# Patient Record
Sex: Female | Born: 1985 | Race: White | Marital: Single | Smoking: Former smoker
Health system: Southern US, Community
[De-identification: ages and names within clinical notes are randomized; demographics above are authoritative.]

---

## 2017-08-06 ENCOUNTER — Other Ambulatory Visit: Payer: Self-pay | Admitting: Orthopedic Surgery

## 2017-08-06 DIAGNOSIS — M4306 Spondylolysis, lumbar region: Secondary | ICD-10-CM

## 2017-08-12 ENCOUNTER — Other Ambulatory Visit: Payer: Self-pay | Admitting: Orthopedic Surgery

## 2017-08-12 DIAGNOSIS — M4306 Spondylolysis, lumbar region: Secondary | ICD-10-CM

## 2017-08-17 ENCOUNTER — Ambulatory Visit
Admission: RE | Admit: 2017-08-17 | Discharge: 2017-08-17 | Disposition: A | Discharge status: Home / Self Care | Payer: Self-pay | Source: Ambulatory Visit | Attending: Orthopedic Surgery | Admitting: Orthopedic Surgery

## 2017-08-17 ENCOUNTER — Ambulatory Visit
Admission: RE | Admit: 2017-08-17 | Discharge: 2017-08-17 | Disposition: A | Discharge status: Home / Self Care | Payer: Worker's Compensation | Source: Ambulatory Visit | Attending: Orthopedic Surgery | Admitting: Orthopedic Surgery

## 2017-08-17 DIAGNOSIS — M4306 Spondylolysis, lumbar region: Secondary | ICD-10-CM

## 2017-08-17 MED ORDER — METHYLPREDNISOLONE ACETATE 40 MG/ML INJ SUSP (RADIOLOG: 120.0000 mg | Freq: Once | INTRAMUSCULAR | Status: DC

## 2018-04-05 ENCOUNTER — Ambulatory Visit (INDEPENDENT_AMBULATORY_CARE_PROVIDER_SITE_OTHER): Payer: Worker's Compensation | Admitting: Orthopaedic Surgery

## 2018-04-05 ENCOUNTER — Telehealth (INDEPENDENT_AMBULATORY_CARE_PROVIDER_SITE_OTHER): Payer: Self-pay

## 2018-04-05 ENCOUNTER — Encounter (INDEPENDENT_AMBULATORY_CARE_PROVIDER_SITE_OTHER): Payer: Self-pay | Admitting: Orthopaedic Surgery

## 2018-04-05 ENCOUNTER — Ambulatory Visit (INDEPENDENT_AMBULATORY_CARE_PROVIDER_SITE_OTHER): Payer: Worker's Compensation

## 2018-04-05 VITALS — BP 177/113 | HR 93 | Ht 64.5 in | Wt 260.0 lb

## 2018-04-05 DIAGNOSIS — G8929 Other chronic pain: Secondary | ICD-10-CM | POA: Diagnosis not present

## 2018-04-05 DIAGNOSIS — M545 Low back pain, unspecified: Secondary | ICD-10-CM

## 2018-04-05 NOTE — Telephone Encounter (Signed)
-----   Message from Eldred Manges, MD sent at 04/05/2018  2:47 PM EST ----- WC thanks

## 2018-04-05 NOTE — Telephone Encounter (Signed)
Faxed todays office note to case manager Robin Niedermeier Fax#[phone]

## 2018-04-05 NOTE — Progress Notes (Signed)
Office Visit Note [facility] transfer   Patient: Karen Fitzpatrick           Date of Birth: September 30, 1985           MRN: [id number] Visit Date: 04/05/2018              Requested by: No referring provider defined for this encounter. PCP: Patient, No Pcp Per   Assessment & Plan: Visit Diagnoses:  1. Chronic low back pain, unspecified back pain laterality, unspecified whether sciatica present   2.   L5 bilateral spondylolysis with L5-S1 spondylolisthesis 5 mm.  Plan: We discussed exercises she can do to work on weight loss to help unload her lumbar spine.  She needs to get down to a BMI of 40 and that target weight is 235.  I plan to recheck her in 3 months when she reaches her target weight if she still having symptoms we could consider myelogram CT scan to evaluate her for dynamic instability with compression of the degenerative disc at the 5 1 level.  She gets good relief with some weight loss and no additional studies would be indicated.  MRI report MRI images were reviewed with the patient and her case manager.  Prescription given for pool therapy or pool membership.  She has a [employer] close to where she works that she could utilize for weight loss which should not bother her back symptoms.  Case manager will work with the [employer]. carrier to see if they can authorize this.  I plan to recheck her in 3 months.  Follow-Up Instructions: Return in about 3 months (around 07/05/2018).   Orders:  Orders Placed This Encounter  Procedures  . XR Lumbar Spine 2-3 Views   No orders of the defined types were placed in this encounter.     Procedures: No procedures performed   Clinical Data: No additional findings.   Subjective: Chief Complaint  Patient presents with  . Lower Back - Pain    HPI 33 year old female here with her case manager Maeola Sarah, RN, CCM with history of on-the-job injury in December 2018 when she was working at [employer] on top of a ladder trying to reach an  empty tote off the top shelf and felt a pop in her back.  2 days later started having increased pain and since that time is been through physical therapy, medication conservative treatment, some improvement with dry needling.  She has increased pain with sitting or standing in 1 position for period of time she does better if she changes positions frequently.  She has midline back pain.  She does have a history of kidney stones and had an episode of nephrolithiasis on the left since her 2018 injury but this is not related.  She had been treated by Dr. Yevette Edwards and had a pars injection bilaterally at L5.  She has had an MRI scan that shows some disc desiccation and trace listhesis at L5-S1.  Other disc showed good hydration and no significant compression.  She did have a mild amount of increased facet joint fluid at L4-5.  Pars defect injections were done under CT and some of the fluid leaked into the epidural space.  He continues to work.  [place]  Review of Systems positive for depression, hypertension.  1/2 pack/day smoking history.  She has 2 children.  Patient Social research officer, government at [employer].  Negative for stroke, heart attack 14 point review of systems otherwise negative  is obtains HPI.   Objective: Vital Signs: BP (!) 177/113   Pulse 93   Ht 5' 4.5" (1.638 m)   Wt 260 lb (117.9 kg)   BMI 43.94 kg/m   Physical Exam Constitutional:      Appearance: She is well-developed.  HENT:     Head: Normocephalic.     Right Ear: External ear normal.     Left Ear: External ear normal.  Eyes:     Pupils: Pupils are equal, round, and reactive to light.  Neck:     Thyroid: No thyromegaly.     Trachea: No tracheal deviation.  Cardiovascular:     Rate and Rhythm: Normal rate.  Pulmonary:     Effort: Pulmonary effort is normal.  Abdominal:     Palpations: Abdomen is soft.  Skin:    General: Skin is warm and dry.  Neurological:     Mental Status: She is alert and  oriented to person, place, and time.  Psychiatric:        Behavior: Behavior normal.     Ortho Exam patient is able to heel and toe walk easily she gets from sitting to standing comfortably.  Forward flexion fingertips to toes with her knees extended.  She has some limitation of extension with some lumbosacral discomfort.  Tenderness at the lumbosacral junction.  No sciatic notch tenderness.  Negative logroll to the hips knee and ankle jerk are 2+ and symmetrical anterior tib gastrocsoleus is symmetrical negative Homan.  No edema.  Good capillary refill.  Specialty Comments:  No specialty comments available.  Imaging: Xr Lumbar Spine 2-3 Views  Result Date: 04/05/2018 AP lateral lumbar spine x-rays with lateral lumbar flexion-extension stress films are obtained and reviewed.  This shows 5 mm of anterolisthesis at L5-S1 with pars defect without shifting on flexion-extension.  Negative for acute changes. Impression: L5-S1 anterolisthesis with pars defect stable on flexion-extension.    PMFS History: There are no active problems to display for this patient.  History reviewed. No pertinent past medical history.  History reviewed. No pertinent family history.  History reviewed. No pertinent surgical history. Social History   Occupational History  . Not on file  Tobacco Use  . Smoking status: Not on file  Substance and Sexual Activity  . Alcohol use: Not on file  . Drug use: Not on file  . Sexual activity: Not on file

## 2018-06-08 ENCOUNTER — Telehealth (INDEPENDENT_AMBULATORY_CARE_PROVIDER_SITE_OTHER): Payer: Self-pay

## 2018-06-08 ENCOUNTER — Other Ambulatory Visit: Payer: Self-pay

## 2018-06-08 ENCOUNTER — Encounter (INDEPENDENT_AMBULATORY_CARE_PROVIDER_SITE_OTHER): Payer: Self-pay | Admitting: Orthopaedic Surgery

## 2018-06-08 ENCOUNTER — Ambulatory Visit (INDEPENDENT_AMBULATORY_CARE_PROVIDER_SITE_OTHER): Payer: Worker's Compensation | Admitting: Orthopaedic Surgery

## 2018-06-08 VITALS — BP 153/105 | HR 82 | Ht 64.5 in | Wt 259.0 lb

## 2018-06-08 DIAGNOSIS — M545 Low back pain, unspecified: Secondary | ICD-10-CM

## 2018-06-08 DIAGNOSIS — G8929 Other chronic pain: Secondary | ICD-10-CM | POA: Diagnosis not present

## 2018-06-08 NOTE — Progress Notes (Signed)
Office Visit Note   Patient: Karen Fitzpatrick           Date of Birth: 26-Jan-1986           MRN: [id number] Visit Date: 06/08/2018              Requested by: No referring provider defined for this encounter. PCP: Patient, No Pcp Per   Assessment & Plan: Visit Diagnoses:  1. Chronic low back pain, unspecified back pain laterality, unspecified whether sciatica present     Plan: Patient will continue to work on weight loss.  I plan to recheck her in 2 months and we will weigh her on return.  She was congratulated on her efforts.  Prescription given for continued water aerobics.  Where she does her therapy they also have an exercise bike she could ride this in addition as part of her therapy.  I discussed with her when she reaches her target weight we can proceed with myelogram CAT scan for evaluation of the L5-S1 spondylolisthesis with pars defect.  I discussed with patient when she reaches her target weight we can proceed with imaging  Follow-Up Instructions: Return in about 2 months (around 08/08/2018).   Orders:  Orders Placed This Encounter  Procedures   Ambulatory referral to Physical Therapy   No orders of the defined types were placed in this encounter.     Procedures: No procedures performed   Clinical Data: No additional findings.   Subjective: Chief Complaint  Patient presents with   Lower Back - Pain, Follow-up    OTJI 02/2017    HPI 33 year old female returns with ongoing low back pain date of injury was in December 2018.  Patient's been doing water therapy and is continued to diet got rid of sweet drinks and is lost 14 pounds she was congratulated on her efforts.  She was here with the rehab nurse Maeola Sarah, RN, CCM.  Fax number is [phone] with [facility].  Patient states she is planning on quitting smoking today.  She is requesting repeat prescription for continued water therapy.  She is lost 14 pounds and her weights have been 254 some days and her goal  to reach BMI of 40 is to get down to 235.  She is continuing to work.  Work slip given regular work.  Patient states she still having symptoms but as she is done therapy and lost a little bit of weight she has had improvement in her back pain buttocks pain and pain in her legs.  Review of Systems 14 point systems positive for depression hypertension smoker she states she is quitting today.  Otherwise unchanged from 04/05/2018.   Objective: Vital Signs: BP (!) 153/105    Pulse 82    Ht 5' 4.5" (1.638 m)    Wt 259 lb (117.5 kg)    BMI 43.77 kg/m   Physical Exam Constitutional:      Appearance: She is well-developed.  HENT:     Head: Normocephalic.     Right Ear: External ear normal.     Left Ear: External ear normal.  Eyes:     Pupils: Pupils are equal, round, and reactive to light.  Neck:     Thyroid: No thyromegaly.     Trachea: No tracheal deviation.  Cardiovascular:     Rate and Rhythm: Normal rate.  Pulmonary:     Effort: Pulmonary effort is normal.  Abdominal:     Palpations: Abdomen is soft.  Skin:    General:  Skin is warm and dry.  Neurological:     Mental Status: She is alert and oriented to person, place, and time.  Psychiatric:        Behavior: Behavior normal.     Ortho Exam patient has normal gait no rash over exposed skin.  No motor atrophy.  She gets from sitting to standing more comfortably. Specialty Comments:  No specialty comments available.  Imaging: No results found.   PMFS History: Patient Active Problem List   Diagnosis Date Noted   Chronic low back pain 06/08/2018   No past medical history on file.  No family history on file.  No past surgical history on file. Social History   Occupational History   Not on file  Tobacco Use   Smoking status: Not on file  Substance and Sexual Activity   Alcohol use: Not on file   Drug use: Not on file   Sexual activity: Not on file

## 2018-06-08 NOTE — Telephone Encounter (Signed)
Faxed office note to case manager Robin Niedermeier Fax#[phone]

## 2018-06-08 NOTE — Telephone Encounter (Signed)
-----   Message from Eldred Manges, MD sent at 06/08/2018  9:53 AM EDT ----- Cc to W/C and rehab nurse

## 2018-08-02 ENCOUNTER — Ambulatory Visit (INDEPENDENT_AMBULATORY_CARE_PROVIDER_SITE_OTHER): Payer: Worker's Compensation | Admitting: Orthopaedic Surgery

## 2018-08-02 ENCOUNTER — Encounter: Payer: Self-pay | Admitting: Orthopaedic Surgery

## 2018-08-02 ENCOUNTER — Other Ambulatory Visit: Payer: Self-pay

## 2018-08-02 ENCOUNTER — Telehealth: Payer: Self-pay

## 2018-08-02 VITALS — Ht 64.0 in | Wt 261.0 lb

## 2018-08-02 DIAGNOSIS — M545 Low back pain, unspecified: Secondary | ICD-10-CM

## 2018-08-02 DIAGNOSIS — G8929 Other chronic pain: Secondary | ICD-10-CM | POA: Diagnosis not present

## 2018-08-02 NOTE — Telephone Encounter (Signed)
-----   Message from Eldred Manges, MD sent at 08/02/2018 10:32 AM EDT ----- Cc W/C and rehab RN

## 2018-08-02 NOTE — Telephone Encounter (Signed)
Faxed note to case Optician, dispensing with [employer] Fax#[phone]

## 2018-08-02 NOTE — Progress Notes (Signed)
Office Visit Note   Patient: Karen Fitzpatrick           Date of Birth: 09/19/1985           MRN: [id number] Visit Date: 08/02/2018              Requested by: No referring provider defined for this encounter. PCP: Patient, No Pcp Per   Assessment & Plan: Visit Diagnoses:  1. Chronic low back pain, unspecified back pain laterality, unspecified whether sciatica present     Plan: Patient has bilateral L5 pars defects with anterolisthesis.  Chronic pain and she needs to proceed with reaching her target weight of 235  she can contact us and we can proceed with lumbar myelogram CT scan for evaluation and see if she is a surgical candidate.  Currently she is not been scheduled for a surgery date for gastric sleeve.  She can call let us know.  She continues on regular work work slip given.  MMI date is not set and no impairment is assigned at this point.  Follow-Up Instructions: No follow-ups on file.   Orders:  No orders of the defined types were placed in this encounter.  No orders of the defined types were placed in this encounter.     Procedures: No procedures performed   Clinical Data: No additional findings.   Subjective: Chief Complaint  Patient presents with  . Lower Back - Follow-up    OTJI 02/2017    HPI 33 year old female seen in follow-up for an on-the-job injury.  She still doing regular work.  She has some spondylolisthesis with spondylolysis at L5.  [place] and is seeing a Teacher, English as a foreign languagebariatric surgeon from [facility] and is in the process of meeting all the criteria to proceed with gastric sleeve procedure.  She quit smoking now for 1 week and has to make it complete month nicotine free.  She has some endoscopy and also has a 10 page report she has to do before the surgery to meet criteria to proceed.  She continues to have ongoing back pain symptoms.  Weight is up a couple pounds since she quit smoking.  Original injury when she fell off a ladder.  Today she is here with  Maeola Sarahobin Niedermeier, RN, CCM, fax number [phone].  Patient continues to have problems with back pain unchanged from my previous notations.  She has had previous pars injections by Dr. Yevette Edwardsumonski.  Desiccation of L5-S1 disc with pars defects. .  Review of Systems positive for morbid obesity BMI 44.8, depression, hypertension.  Negative for stroke heart attack otherwise negative and updated from 04/05/2018 office visit.   Objective: Vital Signs: Ht 5\' 4"  (1.626 m)   Wt 261 lb (118.4 kg)   BMI 44.80 kg/m   Physical Exam Constitutional:      Appearance: She is well-developed.  HENT:     Head: Normocephalic.     Right Ear: External ear normal.     Left Ear: External ear normal.  Eyes:     Pupils: Pupils are equal, round, and reactive to light.  Neck:     Thyroid: No thyromegaly.     Trachea: No tracheal deviation.  Cardiovascular:     Rate and Rhythm: Normal rate.  Pulmonary:     Effort: Pulmonary effort is normal.  Abdominal:     Palpations: Abdomen is soft.  Skin:    General: Skin is warm and dry.  Neurological:     Mental Status: She is alert and  oriented to person, place, and time.  Psychiatric:        Behavior: Behavior normal.     Ortho Exam normal heel toe gait.  Patient can walk on her toes walk on her heels.  She gets from sitting to standing without difficulty.  Continue discomfort with lumbar flexion extension.  Negative logroll to the hips.  No atrophy no rash over exposed skin.  Specialty Comments:  No specialty comments available.  Imaging: No results found.   PMFS History: Patient Active Problem List   Diagnosis Date Noted  . Chronic low back pain 06/08/2018   No past medical history on file.  No family history on file.  No past surgical history on file. Social History   Occupational History  . Not on file  Tobacco Use  . Smoking status: Former Games developer  . Smokeless tobacco: Never Used  Substance and Sexual Activity  . Alcohol use: Not on file   . Drug use: Not on file  . Sexual activity: Not on file

## 2020-03-23 IMAGING — CT CT BIOPSY
1 of 12 series · 1 of 16 positions shown, 2 images · non-contrast
Comparison: none

CLINICAL DATA: Low back pain.  Bilateral L5 pars defects.

[Series 6: needle -guided injection · axial · 0.82mm/px · z∈[-33,-33]mm · 1 of 31 slices shown, 2 images]
[im 1/31  soft-tissue]
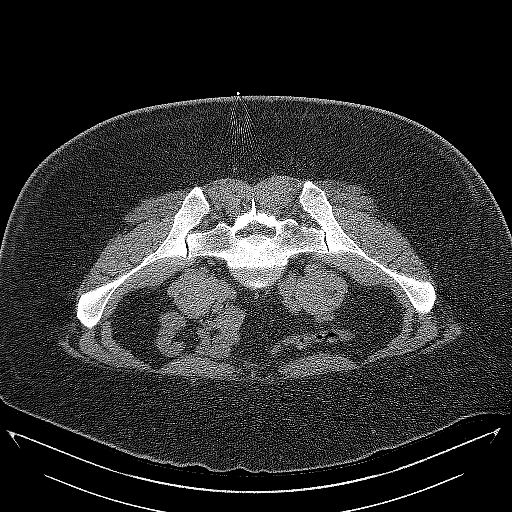
[im 1/31  bone]
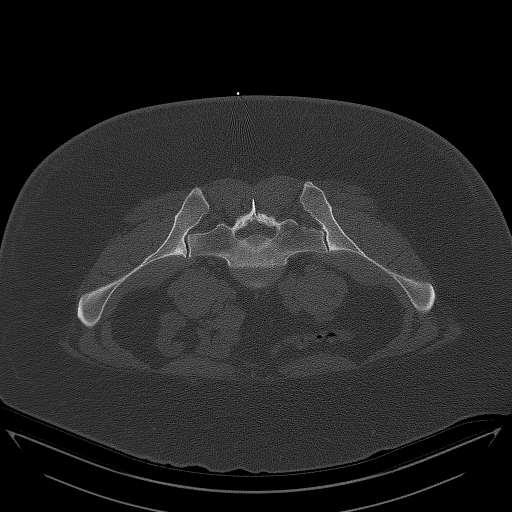

[1 of 16 positions shown; findings below may reference images not displayed]

EXAM:
CT GUIDED BILATERAL L5 PARS DEFECTS INJECTIONS

PROCEDURE:
After a thorough discussion of risks and benefits of the procedure,
including bleeding, infection, injury to nerves, blood vessels, and
adjacent structures, verbal and written consent was obtained.
Specific risks of the procedure included
nondiagnostic/nontherapeutic injection and non target injection. The
patient was placed prone on the CT table and localization was
performed over the lower lumbar spine. Target site marked using CT
guidance. The skin was prepped and draped in the usual sterile
fashion using Betadine soap.

After local anesthesia with 1% lidocaine without epinephrine and
subsequent deep anesthesia, 5 inch 22 gauge spinal needles were
advanced into the L5 pars defects bilaterally under intermittent CT
guidance. 0.2 mL of Isovue M 200 was injected into the right L5 pars
defect and demonstrated contiguous spread into the right L4-5 facet
joint. 0.5 mL of additional contrast was injected and demonstrated
epidural spread but no spread into the contralateral pars defect.
Injection of 0.3 mL of Isovue M 200 into the left L5 pars defect
demonstrated contiguous spread into the left L4-5 facet joint. 60 mg
of Depo-Medrol and 0.75 mL of 1% lidocaine were injected into each
pars defect. The needles were removed and a sterile dressing
applied.

No complications were observed.
IMPRESSION: Successful CT-guided bilateral L5 pars defects injections.
